# Patient Record
Sex: Male | Born: 1990 | Race: Black or African American | Hispanic: No | Marital: Single | Smoking: Current some day smoker
Health system: Southern US, Community
[De-identification: ages and names within clinical notes are randomized; demographics above are authoritative.]

## PROBLEM LIST (undated history)

## (undated) DIAGNOSIS — I10 Essential (primary) hypertension: Secondary | ICD-10-CM

## (undated) DIAGNOSIS — I219 Acute myocardial infarction, unspecified: Secondary | ICD-10-CM

## (undated) HISTORY — PX: CARDIAC CATHETERIZATION: SHX172

---
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
## 2012-06-28 ENCOUNTER — Emergency Department: Payer: Self-pay | Admitting: Emergency Medicine

## 2015-09-03 ENCOUNTER — Emergency Department
Admission: EM | Admit: 2015-09-03 | Discharge: 2015-09-03 | Disposition: A | Discharge status: Home / Self Care | Payer: Worker's Compensation | Attending: Emergency Medicine | Admitting: Emergency Medicine

## 2015-09-03 ENCOUNTER — Encounter: Payer: Self-pay | Admitting: Emergency Medicine

## 2015-09-03 ENCOUNTER — Emergency Department: Payer: Worker's Compensation

## 2015-09-03 DIAGNOSIS — W208XXA Other cause of strike by thrown, projected or falling object, initial encounter: Secondary | ICD-10-CM | POA: Insufficient documentation

## 2015-09-03 DIAGNOSIS — S90212A Contusion of left great toe with damage to nail, initial encounter: Secondary | ICD-10-CM | POA: Diagnosis not present

## 2015-09-03 DIAGNOSIS — Y9389 Activity, other specified: Secondary | ICD-10-CM | POA: Insufficient documentation

## 2015-09-03 DIAGNOSIS — F17200 Nicotine dependence, unspecified, uncomplicated: Secondary | ICD-10-CM | POA: Diagnosis not present

## 2015-09-03 DIAGNOSIS — S90222D Contusion of left lesser toe(s) with damage to nail, subsequent encounter: Secondary | ICD-10-CM

## 2015-09-03 DIAGNOSIS — Y990 Civilian activity done for income or pay: Secondary | ICD-10-CM | POA: Diagnosis not present

## 2015-09-03 DIAGNOSIS — S99922A Unspecified injury of left foot, initial encounter: Secondary | ICD-10-CM | POA: Diagnosis present

## 2015-09-03 DIAGNOSIS — Y929 Unspecified place or not applicable: Secondary | ICD-10-CM | POA: Insufficient documentation

## 2015-09-03 MED ORDER — SULFAMETHOXAZOLE-TRIMETHOPRIM 800-160 MG PO TABS: 1.0000 | ORAL_TABLET | Freq: Two times a day (BID) | ORAL | 0 refills | Status: DC

## 2015-09-03 NOTE — ED Triage Notes (Signed)
Pt dropped a wooden pallet on left great toe two days ago and now having more swelling and bruising. Sent over from [facility] for further eval.

## 2015-09-03 NOTE — ED Notes (Signed)
Xray at the bedside,.

## 2015-09-03 NOTE — ED Notes (Signed)
Spoke with Dennis Stewart [phone]) from [employer], she stated that pt had W/C drug screen [facility] with [facility] yesterday and that it does not need to be repeated today since this is a referral for further treatment.

## 2015-09-03 NOTE — ED Provider Notes (Signed)
[facility] Emergency Department Provider Note  ____________________________________________  Time seen: Approximately 1:49 PM  I have reviewed the triage vital signs and the nursing notes.   HISTORY  Chief Complaint Toe Injury   HPI Dennis Stewart is a 25 y.o. male who presents to the emergency department for evaluation of pain in his left great toe. While at work on Tuesday, he dropped a pallet directly on it. He was evaluated at Urgent care. He is unsure if the toe is broken, but he did have an xray. He returned there today and was advised to come to the ER for further evaluation and treatment. The toe has gotten red, more swollen, more tender, and now has a large blood blister that is lifting the nail off.  History reviewed. No pertinent past medical history.  There are no active problems to display for this patient.   History reviewed. No pertinent surgical history.  Prior to Admission medications   Medication Sig Start Date End Date Taking? Authorizing Provider  sulfamethoxazole-trimethoprim (BACTRIM DS,SEPTRA DS) 800-160 MG tablet Take 1 tablet by mouth 2 (two) times daily. 09/03/15   Chinita Pesterari B Sena Hoopingarner, FNP    Allergies Penicillins  No family history on file.  Social History Social History  Substance Use Topics  . Smoking status: Current Some Day Smoker  . Smokeless tobacco: Never Used  . Alcohol use Yes    Review of Systems  Constitutional: Negative for fever/chills Respiratory: Negative for shortness of breath. Musculoskeletal: Negative for pain. Skin: Positive for hematoma. Neurological: Negative for headaches, focal weakness or numbness. ____________________________________________   PHYSICAL EXAM:  VITAL SIGNS: ED Triage Vitals [09/03/15 1305]  Enc Vitals Group     BP (!) 167/87     Pulse Rate 83     Resp 20     Temp 97.8 F (36.6 C)     Temp Source Oral     SpO2 100 %     Weight 130 lb (59 kg)     Height 5\' 7"   (1.702 m)     Head Circumference      Peak Flow      Pain Score 6     Pain Loc      Pain Edu?      Excl. in GC?      Constitutional: Alert and oriented. Well appearing and in no acute distress. Eyes: Conjunctivae are normal. EOMI. Nose: No congestion/rhinnorhea. Mouth/Throat: Mucous membranes are moist.   Neck: No stridor. Cardiovascular: Good peripheral circulation. Respiratory: Normal respiratory effort.  No retractions. Musculoskeletal: FROM throughout. Neurologic:  Normal speech and language. No gross focal neurologic deficits are appreciated. Skin:  Bulging hematoma surrounding the nail of the great toe on the left foot. Toenail remains firmly attached to the nailbed.   ____________________________________________   LABS (all labs ordered are listed, but only abnormal results are displayed)  Labs Reviewed - No data to display ____________________________________________  EKG   ____________________________________________  RADIOLOGY  No acute bony abnormality of the left great toe per radiology. ____________________________________________   PROCEDURES  Procedure(s) performed: After cleansing the toe with alcohol, subungal hematoma of the left great toe drained via electrocautery. Surrounding hematoma drained with #18 gauge needle with 1 small puncture on the lateral aspect of the great toe.  ____________________________________________   INITIAL IMPRESSION / ASSESSMENT AND PLAN / ED COURSE  Pertinent labs & imaging results that were available during my care of the patient were reviewed by me and considered in my medical  decision making (see chart for details).  Case discussed with Dr. Orland Jarred who advised draining the hematoma. He will see him in follow up in the office next week.  He will be advised to take the bactrim as prescribed.  He was advised to follow up with Dr. Orland Jarred.  He was also advised to return to the emergency department for symptoms that  change or worsen if unable to schedule an appointment.  ____________________________________________   FINAL CLINICAL IMPRESSION(S) / ED DIAGNOSES  Final diagnoses:  Subungual hematoma of toe of left foot, subsequent encounter    New Prescriptions   SULFAMETHOXAZOLE-TRIMETHOPRIM (BACTRIM DS,SEPTRA DS) 800-160 MG TABLET    Take 1 tablet by mouth 2 (two) times daily.    Note:  This document was prepared using Dragon voice recognition software and may include unintentional dictation errors.    Chinita Pester, FNP 09/03/15 1621    Gayla Doss, MD 09/04/15 [phone]

## 2015-12-12 ENCOUNTER — Emergency Department
Admission: EM | Admit: 2015-12-12 | Discharge: 2015-12-12 | Disposition: A | Discharge status: Home / Self Care | Payer: No Typology Code available for payment source | Attending: Student in an Organized Health Care Education/Training Program | Admitting: Student in an Organized Health Care Education/Training Program

## 2015-12-12 ENCOUNTER — Emergency Department: Payer: No Typology Code available for payment source

## 2015-12-12 ENCOUNTER — Encounter: Payer: Self-pay | Admitting: Emergency Medicine

## 2015-12-12 DIAGNOSIS — F17210 Nicotine dependence, cigarettes, uncomplicated: Secondary | ICD-10-CM | POA: Insufficient documentation

## 2015-12-12 DIAGNOSIS — S79911A Unspecified injury of right hip, initial encounter: Secondary | ICD-10-CM | POA: Diagnosis present

## 2015-12-12 DIAGNOSIS — Y9389 Activity, other specified: Secondary | ICD-10-CM | POA: Diagnosis not present

## 2015-12-12 DIAGNOSIS — S7001XA Contusion of right hip, initial encounter: Secondary | ICD-10-CM | POA: Insufficient documentation

## 2015-12-12 DIAGNOSIS — Y999 Unspecified external cause status: Secondary | ICD-10-CM | POA: Diagnosis not present

## 2015-12-12 DIAGNOSIS — Z79899 Other long term (current) drug therapy: Secondary | ICD-10-CM | POA: Insufficient documentation

## 2015-12-12 DIAGNOSIS — Y92410 Unspecified street and highway as the place of occurrence of the external cause: Secondary | ICD-10-CM | POA: Insufficient documentation

## 2015-12-12 MED ORDER — NAPROXEN 500 MG PO TBEC: 500.0000 mg | DELAYED_RELEASE_TABLET | Freq: Two times a day (BID) | ORAL | 0 refills | Status: AC

## 2015-12-12 MED ORDER — CYCLOBENZAPRINE HCL 5 MG PO TABS: 5.0000 mg | ORAL_TABLET | Freq: Three times a day (TID) | ORAL | 0 refills | Status: DC | PRN

## 2015-12-12 NOTE — ED Notes (Signed)
Pt verbalized understanding of discharge instructions. NAD at this time. 
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
## 2018-03-07 ENCOUNTER — Observation Stay
Admission: EM | Admit: 2018-03-07 | Discharge: 2018-03-08 | Disposition: A | Discharge status: Home / Self Care | Payer: Managed Care, Other (non HMO) | Attending: Internal Medicine | Admitting: Internal Medicine

## 2018-03-07 ENCOUNTER — Other Ambulatory Visit: Payer: Self-pay

## 2018-03-07 ENCOUNTER — Encounter: Payer: Self-pay | Admitting: Emergency Medicine

## 2018-03-07 DIAGNOSIS — Z7982 Long term (current) use of aspirin: Secondary | ICD-10-CM | POA: Insufficient documentation

## 2018-03-07 DIAGNOSIS — I2510 Atherosclerotic heart disease of native coronary artery without angina pectoris: Secondary | ICD-10-CM | POA: Insufficient documentation

## 2018-03-07 DIAGNOSIS — I10 Essential (primary) hypertension: Secondary | ICD-10-CM | POA: Diagnosis present

## 2018-03-07 DIAGNOSIS — I252 Old myocardial infarction: Secondary | ICD-10-CM | POA: Diagnosis not present

## 2018-03-07 DIAGNOSIS — Z880 Allergy status to penicillin: Secondary | ICD-10-CM | POA: Insufficient documentation

## 2018-03-07 DIAGNOSIS — F17210 Nicotine dependence, cigarettes, uncomplicated: Secondary | ICD-10-CM | POA: Insufficient documentation

## 2018-03-07 DIAGNOSIS — Z7902 Long term (current) use of antithrombotics/antiplatelets: Secondary | ICD-10-CM | POA: Diagnosis not present

## 2018-03-07 DIAGNOSIS — F410 Panic disorder [episodic paroxysmal anxiety] without agoraphobia: Secondary | ICD-10-CM | POA: Insufficient documentation

## 2018-03-07 DIAGNOSIS — Z79899 Other long term (current) drug therapy: Secondary | ICD-10-CM | POA: Diagnosis not present

## 2018-03-07 DIAGNOSIS — F419 Anxiety disorder, unspecified: Secondary | ICD-10-CM | POA: Insufficient documentation

## 2018-03-07 DIAGNOSIS — J101 Influenza due to other identified influenza virus with other respiratory manifestations: Secondary | ICD-10-CM | POA: Diagnosis not present

## 2018-03-07 DIAGNOSIS — E876 Hypokalemia: Secondary | ICD-10-CM | POA: Diagnosis not present

## 2018-03-07 DIAGNOSIS — I160 Hypertensive urgency: Principal | ICD-10-CM | POA: Insufficient documentation

## 2018-03-07 DIAGNOSIS — R040 Epistaxis: Secondary | ICD-10-CM | POA: Diagnosis present

## 2018-03-07 DIAGNOSIS — R041 Hemorrhage from throat: Secondary | ICD-10-CM | POA: Diagnosis not present

## 2018-03-07 DIAGNOSIS — E785 Hyperlipidemia, unspecified: Secondary | ICD-10-CM | POA: Diagnosis not present

## 2018-03-07 HISTORY — DX: Essential (primary) hypertension: I10

## 2018-03-07 HISTORY — DX: Acute myocardial infarction, unspecified: I21.9

## 2018-03-07 LAB — BASIC METABOLIC PANEL
Anion gap: 5 (ref 5–15)
BUN: 17 mg/dL (ref 6–20)
CO2: 28 mmol/L (ref 22–32)
Calcium: 8.9 mg/dL (ref 8.9–10.3)
Chloride: 106 mmol/L (ref 98–111)
Creatinine, Ser: 0.94 mg/dL (ref 0.61–1.24)
GFR calc Af Amer: >60 mL/min (ref >60)
GFR calc non Af Amer: >60 mL/min (ref >60)
Glucose, Bld: 109 mg/dL — ABNORMAL HIGH (ref 70–99)
Potassium: 3.4 mmol/L — ABNORMAL LOW (ref 3.5–5.1)
SODIUM: 139 mmol/L (ref 135–145)

## 2018-03-07 LAB — CBC WITH DIFFERENTIAL/PLATELET
Abs Immature Granulocytes: 0.02 10*3/uL (ref 0.00–0.07)
Basophils Absolute: 0 10*3/uL (ref 0.0–0.1)
Basophils Relative: 0 %
EOS ABS: 0 10*3/uL (ref 0.0–0.5)
Eosinophils Relative: 1 %
HCT: 38.7 % — ABNORMAL LOW (ref 39.0–52.0)
Hemoglobin: 13.1 g/dL (ref 13.0–17.0)
Immature Granulocytes: 0 %
Lymphocytes Relative: 12 %
Lymphs Abs: 1 10*3/uL (ref 0.7–4.0)
MCH: 29.4 pg (ref 26.0–34.0)
MCHC: 33.9 g/dL (ref 30.0–36.0)
MCV: 86.8 fL (ref 80.0–100.0)
Monocytes Absolute: 0.6 10*3/uL (ref 0.1–1.0)
Monocytes Relative: 7 %
Neutro Abs: 6.5 10*3/uL (ref 1.7–7.7)
Neutrophils Relative %: 80 %
PLATELETS: 273 10*3/uL (ref 150–400)
RBC: 4.46 MIL/uL (ref 4.22–5.81)
RDW: 12.2 % (ref 11.5–15.5)
WBC: 8.1 10*3/uL (ref 4.0–10.5)
nRBC: 0 % (ref 0.0–0.2)

## 2018-03-07 LAB — RESPIRATORY PANEL BY PCR
Adenovirus: NOT DETECTED
Bordetella pertussis: NOT DETECTED
Chlamydophila pneumoniae: NOT DETECTED
Coronavirus 229E: NOT DETECTED
Coronavirus HKU1: NOT DETECTED
Coronavirus NL63: NOT DETECTED
Coronavirus OC43: NOT DETECTED
Influenza A H1 2009: DETECTED — AB
Influenza B: NOT DETECTED
Metapneumovirus: NOT DETECTED
Mycoplasma pneumoniae: NOT DETECTED
Parainfluenza Virus 1: NOT DETECTED
Parainfluenza Virus 2: NOT DETECTED
Parainfluenza Virus 3: NOT DETECTED
Parainfluenza Virus 4: NOT DETECTED
RHINOVIRUS / ENTEROVIRUS - RVPPCR: NOT DETECTED
Respiratory Syncytial Virus: NOT DETECTED

## 2018-03-07 LAB — PROTIME-INR
INR: 0.92
PROTHROMBIN TIME: 12.3 s (ref 11.4–15.2)

## 2018-03-07 LAB — TSH: TSH: 1.149 u[IU]/mL (ref 0.350–4.500)

## 2018-03-07 LAB — TROPONIN I: Troponin I: <0.03 ng/mL (ref <0.03)

## 2018-03-07 LAB — INFLUENZA PANEL BY PCR (TYPE A & B)
Influenza A By PCR: POSITIVE — AB
Influenza B By PCR: NEGATIVE

## 2018-03-07 MED ORDER — CLONIDINE HCL 0.1 MG PO TABS
0.1000 mg | ORAL_TABLET | Freq: Once | ORAL | Status: DC
  Filled 2018-03-07: qty 1

## 2018-03-07 MED ORDER — AZITHROMYCIN 250 MG PO TABS
250.0000 mg | ORAL_TABLET | Freq: Every day | ORAL | Status: DC
  Administered 2018-03-08: 250 mg via ORAL
  Filled 2018-03-07: qty 1

## 2018-03-07 MED ORDER — ACETAMINOPHEN 650 MG RE SUPP: 650.0000 mg | Freq: Four times a day (QID) | RECTAL | Status: DC | PRN

## 2018-03-07 MED ORDER — AZITHROMYCIN 250 MG PO TABS
500.0000 mg | ORAL_TABLET | Freq: Every day | ORAL | Status: AC
  Administered 2018-03-07: 500 mg via ORAL
  Filled 2018-03-07: qty 2

## 2018-03-07 MED ORDER — HYDRALAZINE HCL 20 MG/ML IJ SOLN
10.0000 mg | Freq: Once | INTRAMUSCULAR | Status: AC
  Administered 2018-03-07: 10 mg via INTRAVENOUS

## 2018-03-07 MED ORDER — MENTHOL 3 MG MT LOZG
1.0000 | LOZENGE | OROMUCOSAL | Status: DC | PRN
  Administered 2018-03-07 – 2018-03-08 (×3): 3 mg via ORAL
  Filled 2018-03-07: qty 9

## 2018-03-07 MED ORDER — CLINDAMYCIN PHOSPHATE 600 MG/50ML IV SOLN
600.0000 mg | Freq: Once | INTRAVENOUS | Status: AC
  Administered 2018-03-07: 600 mg via INTRAVENOUS
  Filled 2018-03-07: qty 50

## 2018-03-07 MED ORDER — HYDRALAZINE HCL 20 MG/ML IJ SOLN
10.0000 mg | Freq: Four times a day (QID) | INTRAMUSCULAR | Status: DC | PRN
  Administered 2018-03-07: 10 mg via INTRAVENOUS
  Filled 2018-03-07: qty 1

## 2018-03-07 MED ORDER — NITROGLYCERIN 2 % TD OINT
0.5000 [in_us] | TOPICAL_OINTMENT | TRANSDERMAL | Status: AC
  Administered 2018-03-07: 0.5 [in_us] via TOPICAL
  Filled 2018-03-07: qty 1

## 2018-03-07 MED ORDER — ONDANSETRON HCL 4 MG/2ML IJ SOLN: 4.0000 mg | Freq: Four times a day (QID) | INTRAMUSCULAR | Status: DC | PRN

## 2018-03-07 MED ORDER — OSELTAMIVIR PHOSPHATE 75 MG PO CAPS
75.0000 mg | ORAL_CAPSULE | Freq: Two times a day (BID) | ORAL | Status: DC
  Administered 2018-03-07 – 2018-03-08 (×2): 75 mg via ORAL
  Filled 2018-03-07 (×2): qty 1

## 2018-03-07 MED ORDER — CLINDAMYCIN PHOSPHATE 600 MG/4ML IJ SOLN
600.0000 mg | Freq: Once | INTRAMUSCULAR | Status: DC
  Filled 2018-03-07: qty 4

## 2018-03-07 MED ORDER — CLINDAMYCIN PHOSPHATE 600 MG/50ML IV SOLN: 600.0000 mg | Freq: Once | INTRAVENOUS | Status: DC

## 2018-03-07 MED ORDER — LORAZEPAM 2 MG/ML IJ SOLN
0.5000 mg | Freq: Once | INTRAMUSCULAR | Status: AC
  Administered 2018-03-07: 0.5 mg via INTRAVENOUS
  Filled 2018-03-07: qty 1

## 2018-03-07 MED ORDER — HYDRALAZINE HCL 20 MG/ML IJ SOLN
10.0000 mg | Freq: Once | INTRAMUSCULAR | Status: AC
  Administered 2018-03-07: 10 mg via INTRAVENOUS
  Filled 2018-03-07: qty 1

## 2018-03-07 MED ORDER — METOPROLOL SUCCINATE ER 25 MG PO TB24
25.0000 mg | ORAL_TABLET | Freq: Every day | ORAL | Status: DC
  Administered 2018-03-07: 25 mg via ORAL
  Filled 2018-03-07: qty 1

## 2018-03-07 MED ORDER — ACETAMINOPHEN 325 MG PO TABS
650.0000 mg | ORAL_TABLET | Freq: Four times a day (QID) | ORAL | Status: DC | PRN
  Administered 2018-03-07 – 2018-03-08 (×4): 650 mg via ORAL
  Filled 2018-03-07 (×4): qty 2

## 2018-03-07 MED ORDER — ONDANSETRON HCL 4 MG PO TABS: 4.0000 mg | ORAL_TABLET | Freq: Four times a day (QID) | ORAL | Status: DC | PRN

## 2018-03-07 MED ORDER — POTASSIUM CHLORIDE CRYS ER 20 MEQ PO TBCR
40.0000 meq | EXTENDED_RELEASE_TABLET | Freq: Once | ORAL | Status: AC
  Administered 2018-03-07: 40 meq via ORAL
  Filled 2018-03-07: qty 2

## 2018-03-07 MED ORDER — LISINOPRIL 20 MG PO TABS
20.0000 mg | ORAL_TABLET | Freq: Every day | ORAL | Status: DC
  Administered 2018-03-07 – 2018-03-08 (×2): 20 mg via ORAL
  Filled 2018-03-07 (×2): qty 1

## 2018-03-07 MED ORDER — CLINDAMYCIN HCL 150 MG PO CAPS: 300.0000 mg | ORAL_CAPSULE | Freq: Once | ORAL | Status: DC

## 2018-03-07 MED ORDER — LABETALOL HCL 5 MG/ML IV SOLN
10.0000 mg | INTRAVENOUS | Status: AC
  Administered 2018-03-07: 10 mg via INTRAVENOUS
  Filled 2018-03-07: qty 4

## 2018-03-07 MED ORDER — DOCUSATE SODIUM 100 MG PO CAPS
100.0000 mg | ORAL_CAPSULE | Freq: Two times a day (BID) | ORAL | Status: DC
  Administered 2018-03-07 – 2018-03-08 (×3): 100 mg via ORAL
  Filled 2018-03-07 (×3): qty 1

## 2018-03-07 MED ORDER — HYDRALAZINE HCL 25 MG PO TABS
25.0000 mg | ORAL_TABLET | Freq: Three times a day (TID) | ORAL | Status: DC
  Administered 2018-03-07 – 2018-03-08 (×3): 25 mg via ORAL
  Filled 2018-03-07 (×3): qty 1

## 2018-03-07 NOTE — H&P (Signed)
Dennis Stewart is an 28 y.o. male.   Chief Complaint: Nosebleed HPI: The patient with past medical history of hypertension and CAD status post MI presents to the emergency department due to nosebleed.  The patient reports that she has had a nosebleed for the last 3 nights presumably due to dry nose from blowing her nose frequently due to viral URI.  Denied the patient had more bleeding than he could control.  His nose was packed in the emergency department.  During the procedure the patient had a panic attack which prompted the emergency department staff to give Ativan.  The patient's blood pressure was found to be greater than 180/100.  Nitropaste was applied to his chest and the patient was given 2 doses of hydralazine prior to the emergency department staff call hospitalist service for admission  Past Medical History:  Diagnosis Date  . Hypertension   . MI (myocardial infarction) [facility](HCC)     Past Surgical History:  Procedure Laterality Date  . CARDIAC CATHETERIZATION      No family history on file. Unknown by patient  Social History:  reports that he has been smoking cigarettes. He has been smoking about 0.25 packs per day. He has never used smokeless tobacco. He reports current alcohol use. He reports previous drug use.  Allergies:  Allergies  Allergen Reactions  . Penicillins Anaphylaxis    Did it involve swelling of the face/tongue/throat, SOB, or low BP? Yes Did it involve sudden or severe rash/hives, skin peeling, or any reaction on the inside of your mouth or nose? Yes Did you need to seek medical attention at a hospital or doctor's office? No When did it last happen?childhood If all above answers are "NO", may proceed with cephalosporin use.     Prior to Admission medications   Medication Sig Start Date End Date Taking? Authorizing Provider  aspirin 81 MG chewable tablet Chew 81 mg by mouth daily. 11/17/17  Yes [provider]  atorvastatin (LIPITOR) 80 MG  tablet Take 80 mg by mouth at bedtime.  12/23/17   [provider]  clopidogrel (PLAVIX) 75 MG tablet Take 75 mg by mouth daily. 02/28/18   [provider]  isosorbide mononitrate (IMDUR) 30 MG 24 hr tablet Take 30 mg by mouth daily. 11/17/17   [provider]  lisinopril (PRINIVIL,ZESTRIL) 20 MG tablet Take 20 mg by mouth daily. 11/19/17   [provider]  metoprolol succinate (TOPROL-XL) 25 MG 24 hr tablet Take 25 mg by mouth daily. 12/23/17   [provider]     Results for orders placed or performed during the hospital encounter of 03/07/18 (from the past 48 hour(s))  CBC with Differential     Status: Abnormal   Collection Time: 03/07/18  3:47 AM  Result Value Ref Range   WBC 8.1 4.0 - 10.5 K/uL   RBC 4.46 4.22 - 5.81 MIL/uL   Hemoglobin 13.1 13.0 - 17.0 g/dL   HCT 16.138.7 (L) 09.639.0 - 04.552.0 %   MCV 86.8 80.0 - 100.0 fL   MCH 29.4 26.0 - 34.0 pg   MCHC 33.9 30.0 - 36.0 g/dL   RDW 40.912.2 81.111.5 - 91.415.5 %   Platelets 273 150 - 400 K/uL   nRBC 0.0 0.0 - 0.2 %   Neutrophils Relative % 80 %   Neutro Abs 6.5 1.7 - 7.7 K/uL   Lymphocytes Relative 12 %   Lymphs Abs 1.0 0.7 - 4.0 K/uL   Monocytes Relative 7 %  Monocytes Absolute 0.6 0.1 - 1.0 K/uL   Eosinophils Relative 1 %   Eosinophils Absolute 0.0 0.0 - 0.5 K/uL   Basophils Relative 0 %   Basophils Absolute 0.0 0.0 - 0.1 K/uL   Immature Granulocytes 0 %   Abs Immature Granulocytes 0.02 0.00 - 0.07 K/uL    Comment: Performed at [facility], [address] [place], [place] [address]  Basic metabolic panel     Status: Abnormal   Collection Time: 03/07/18  3:47 AM  Result Value Ref Range   Sodium 139 135 - 145 mmol/L   Potassium 3.4 (L) 3.5 - 5.1 mmol/L   Chloride 106 98 - 111 mmol/L   CO2 28 22 - 32 mmol/L   Glucose, Bld 109 (H) 70 - 99 mg/dL   BUN 17 6 - 20 mg/dL   Creatinine, Ser 6.040.94 0.61 - 1.24 mg/dL   Calcium 8.9 8.9 - 54.010.3 mg/dL   GFR calc non Af Amer >60 >60 mL/min   GFR  calc Af Amer >60 >60 mL/min   Anion gap 5 5 - 15    Comment: Performed at [facility], [address] [place], [place] [address]  Protime-INR     Status: None   Collection Time: 03/07/18  3:47 AM  Result Value Ref Range   Prothrombin Time 12.3 11.4 - 15.2 seconds   INR 0.92     Comment: Performed at [facility], [address] [place], [place] [address]  Troponin I - Add-On to previous collection     Status: None   Collection Time: 03/07/18  3:47 AM  Result Value Ref Range   Troponin I <0.03 <0.03 ng/mL    Comment: Performed at [facility], [address] [place], [place] [address]  TSH     Status: None   Collection Time: 03/07/18  3:47 AM  Result Value Ref Range   TSH 1.149 0.350 - 4.500 uIU/mL    Comment: Performed by a 3rd Generation assay with a functional sensitivity of <=0.01 uIU/mL. Performed at [facility], [address] [place], [place] [address]    No results found.  Review of Systems  Constitutional: Negative for chills and fever.  HENT: Negative for sore throat and tinnitus.   Eyes: Negative for blurred vision and redness.  Respiratory: Negative for cough and shortness of breath.   Cardiovascular: Negative for chest pain, palpitations, orthopnea and PND.  Gastrointestinal: Negative for abdominal pain, diarrhea, nausea and vomiting.  Genitourinary: Negative for dysuria, frequency and urgency.  Musculoskeletal: Negative for joint pain and myalgias.  Skin: Negative for rash.       No lesions  Neurological: Negative for speech change, focal weakness and weakness.  Endo/Heme/Allergies: Does not bruise/bleed easily.       No temperature intolerance  Psychiatric/Behavioral: Negative for depression and suicidal ideas.    Blood pressure (!) 181/102, pulse (!) 110, temperature 100 F (37.8 C), temperature source Oral, resp. rate 20, height 5\' 7"  (1.702 m), weight 72.6 kg, SpO2 99 %. Physical Exam  Vitals  reviewed. Constitutional: He is oriented to person, place, and time. He appears well-developed and well-nourished. No distress.  HENT:  Head: Normocephalic and atraumatic.  Mouth/Throat: Oropharynx is clear and moist.  Eyes: Pupils are equal, round, and reactive to light. Conjunctivae and EOM are normal. No scleral icterus.  Neck: Normal range of motion. Neck supple. No JVD present. No tracheal deviation present. No thyromegaly present.  Cardiovascular: Normal rate, regular rhythm and  normal heart sounds. Exam reveals no gallop and no friction rub.  No murmur heard. Respiratory: Effort normal and breath sounds normal. No respiratory distress.  GI: Soft. Bowel sounds are normal. He exhibits no distension.  Genitourinary:    Genitourinary Comments: Deferred   Musculoskeletal: Normal range of motion.        General: No edema.  Lymphadenopathy:    He has no cervical adenopathy.  Neurological: He is alert and oriented to person, place, and time. No cranial nerve deficit.  Skin: Skin is warm and dry. No rash noted. No erythema.  Psychiatric: He has a normal mood and affect. His behavior is normal. Judgment and thought content normal.     Assessment/Plan This is a 28 year old male admitted for transfer urgency. 1.  Hypertensive urgency: Without chest pain or shortness of breath with the patient does have a nosebleed.  Pressure is decreased from 182/110 to 164/100 at this time.  I will give a dose of labetalol 10 mg IV to achieve full blood pressure of no less than 140/80 so is not to exceed 30% drop.  Cardiac enzymes negative at this time.  No EKG changes.  Continue lisinopril and metoprolol 2.  Epistaxis: The patient is on aspirin and Plavix.  Nose is packed at this time.  Evidence of posterior pharyngeal bleed.  The ED staff was given clindamycin 3.  CAD: Stable; benefit seems to outweigh the risk of continuing aspirin and Plavix at this time.  Monitor telemetry.  Imdur per home regimen 4.   Hypokalemia: Replete potassium 5.  Hyperlipidemia: Continue statin therapy 6.  DVT prophylaxis: SCDs 7.  GI prophylaxis: None The patient is a full code.  Time spent on admission orders and patient care approximately 45 minutes  Arnaldo Natal, MD 03/07/2018, 6:58 AM

## 2018-03-07 NOTE — Plan of Care (Signed)
  Problem: Education: Goal: Knowledge of General Education information will improve Description Including pain rating scale, medication(s)/side effects and non-pharmacologic comfort measures Outcome: Progressing   

## 2018-03-07 NOTE — ED Triage Notes (Signed)
Pt presents to ED with nosebleed for the past 3 days. Is currently taking blood thinners due to MI last year. Does have hx of HTN.

## 2018-03-07 NOTE — ED Notes (Signed)
Pt has some oozing from right eye lid. Dr Dolores Frame at bedside. Orders recieved

## 2018-03-07 NOTE — ED Notes (Signed)
ED TO INPATIENT HANDOFF REPORT  ED Nurse Name and Phone #: Legrand ComoLinda RN  [phone]  Name/Age/Gender Dennis Stewart 28 y.o. male Room/Bed: ED13A/ED13A  Code Status   Code Status: Not on file  Home/SNF/Other Patient oriented to: self, place, time and situation Is this baseline yes  Triage Complete: Triage complete   Chief Complaint nose bleed  Triage Note Pt presents to ED with nosebleed for the past 3 days. Is currently taking blood thinners due to MI last year. Does have hx of HTN.    Allergies Allergies  Allergen Reactions  . Penicillins Anaphylaxis    Did it involve swelling of the face/tongue/throat, SOB, or low BP? Yes Did it involve sudden or severe rash/hives, skin peeling, or any reaction on the inside of your mouth or nose? Yes Did you need to seek medical attention at a hospital or doctor's office? No When did it last happen?childhood If all above answers are "NO", may proceed with cephalosporin use.     Level of Care/Admitting Diagnosis ED Disposition    ED Disposition Condition Comment   Admit  Hospital Area: [facility] [[id number]]  Level of Care: Med-Surg [16]  Diagnosis: Hypertensive urgency [454098][[id number]]  Admitting Physician: Arnaldo NatalDIAMOND, MICHAEL S [[id number]]  Attending Physician: Arnaldo NatalDIAMOND, MICHAEL S [[id number]]  PT Class (Do Not Modify): Observation [104]  PT Acc Code (Do Not Modify): Observation [10022]       Medical/Surgery History Past Medical History:  Diagnosis Date  . Hypertension   . MI (myocardial infarction) [facility](HCC)    Past Surgical History:  Procedure Laterality Date  . CARDIAC CATHETERIZATION       IV Location/Drains/Wounds Patient Lines/Drains/Airways Status   Active Line/Drains/Airways    Name:   Placement date:   Placement time:   Site:   Days:   Peripheral IV 03/07/18 Left Hand   03/07/18    0345    Hand   less than 1          Intake/Output Last 24 hours No intake or output data in the 24 hours ending 03/07/18  0747  Labs/Imaging Results for orders placed or performed during the hospital encounter of 03/07/18 (from the past 48 hour(s))  CBC with Differential     Status: Abnormal   Collection Time: 03/07/18  3:47 AM  Result Value Ref Range   WBC 8.1 4.0 - 10.5 K/uL   RBC 4.46 4.22 - 5.81 MIL/uL   Hemoglobin 13.1 13.0 - 17.0 g/dL   HCT 08.638.7 (L) 57.839.0 - 46.952.0 %   MCV 86.8 80.0 - 100.0 fL   MCH 29.4 26.0 - 34.0 pg   MCHC 33.9 30.0 - 36.0 g/dL   RDW 62.912.2 52.811.5 - 41.315.5 %   Platelets 273 150 - 400 K/uL   nRBC 0.0 0.0 - 0.2 %   Neutrophils Relative % 80 %   Neutro Abs 6.5 1.7 - 7.7 K/uL   Lymphocytes Relative 12 %   Lymphs Abs 1.0 0.7 - 4.0 K/uL   Monocytes Relative 7 %   Monocytes Absolute 0.6 0.1 - 1.0 K/uL   Eosinophils Relative 1 %   Eosinophils Absolute 0.0 0.0 - 0.5 K/uL   Basophils Relative 0 %   Basophils Absolute 0.0 0.0 - 0.1 K/uL   Immature Granulocytes 0 %   Abs Immature Granulocytes 0.02 0.00 - 0.07 K/uL    Comment: Performed at [facility], [address] [place], [place] [address]  Basic metabolic panel     Status: Abnormal  Collection Time: 03/07/18  3:47 AM  Result Value Ref Range   Sodium 139 135 - 145 mmol/L   Potassium 3.4 (L) 3.5 - 5.1 mmol/L   Chloride 106 98 - 111 mmol/L   CO2 28 22 - 32 mmol/L   Glucose, Bld 109 (H) 70 - 99 mg/dL   BUN 17 6 - 20 mg/dL   Creatinine, Ser 9.93 0.61 - 1.24 mg/dL   Calcium 8.9 8.9 - 71.6 mg/dL   GFR calc non Af Amer >60 >60 mL/min   GFR calc Af Amer >60 >60 mL/min   Anion gap 5 5 - 15    Comment: Performed at [facility], [address] [place], [place] [address]  Protime-INR     Status: None   Collection Time: 03/07/18  3:47 AM  Result Value Ref Range   Prothrombin Time 12.3 11.4 - 15.2 seconds   INR 0.92     Comment: Performed at [facility], [address] [place], [place] [address]  Troponin I - Add-On to previous collection     Status: None   Collection Time: 03/07/18  3:47 AM  Result  Value Ref Range   Troponin I <0.03 <0.03 ng/mL    Comment: Performed at [facility], [address] [place], [place] [address]  TSH     Status: None   Collection Time: 03/07/18  3:47 AM  Result Value Ref Range   TSH 1.149 0.350 - 4.500 uIU/mL    Comment: Performed by a 3rd Generation assay with a functional sensitivity of <=0.01 uIU/mL. Performed at [facility], [address] [place], [place] [address]    No results found.  Pending Labs Unresulted Labs (From admission, onward)   None      Vitals/Pain Today's Vitals   03/07/18 0630 03/07/18 0700 03/07/18 0710 03/07/18 0742  BP: (!) 173/107 (!) 164/100  (!) 164/98  Pulse: (!) 112 (!) 112 (!) 110 99  Resp:    20  Temp:      TempSrc:      SpO2: 99% 97% 97% 99%  Weight:      Height:      PainSc:    0-No pain    Isolation Precautions No active isolations  Medications Medications  acetaminophen (TYLENOL) tablet 650 mg (650 mg Oral Given 03/07/18 7824)    Or  acetaminophen (TYLENOL) suppository 650 mg ( Rectal See Alternative 03/07/18 2353)  labetalol (NORMODYNE,TRANDATE) injection 10 mg (has no administration in time range)  hydrALAZINE (APRESOLINE) injection 10 mg (10 mg Intravenous Given 03/07/18 0355)  LORazepam (ATIVAN) injection 0.5 mg (0.5 mg Intravenous Given 03/07/18 0427)  hydrALAZINE (APRESOLINE) injection 10 mg (10 mg Intravenous Given 03/07/18 0430)  nitroGLYCERIN (NITROGLYN) 2 % ointment 0.5 inch (0.5 inches Topical Given 03/07/18 0538)  clindamycin (CLEOCIN) IVPB 600 mg (0 mg Intravenous Stopped 03/07/18 0711)    Mobility walks Low fall risk   Focused Assessments Pulmonary Assessment Handoff:  Lung sounds:   O2 Device: Room Air        Recommendations: See Admitting Provider Note  Report given to: Eula Listen  Additional Notes: family at bedside   No bleeding noted at present

## 2018-03-07 NOTE — Progress Notes (Signed)
Advanced care plan. Purpose of the Encounter: CODE STATUS Parties in Attendance: Patient Patient's Decision Capacity: Good Subjective/Patient's story: Presented to the emergency room for elevated blood pressure and nasal bleed Objective/Medical story Needs anterior nasal packing and control of blood pressure with multiple medications Goals of care determination:  Advance care directives and goals of care discussed, patient wants everything done which includes cpr, intubation and ventilator if need arises. CODE STATUS: Full code Time spent discussing advanced care planning: 16 minutes

## 2018-03-07 NOTE — Progress Notes (Addendum)
[employer] - [place] [facility]   PATIENT NAME: Dennis Stewart    MR#:  [id number]  DATE OF BIRTH:  Jul 08, 1990  SUBJECTIVE:  CHIEF COMPLAINT:   Chief Complaint  Patient presents with  . Epistaxis  Patient seen and evaluated today Has nasal packing on the right side No complaints of any shortness of breath, chest pain Has elevated blood pressure when he presented to the emergency room Complains of any dizziness  REVIEW OF SYSTEMS:    ROS  CONSTITUTIONAL: No documented fever. No fatigue, weakness. No weight gain, no weight loss.  EYES: No blurry or double vision.  ENT: No tinnitus. No postnasal drip. No redness of the oropharynx.  Has nasal packing RESPIRATORY: No cough, no wheeze, no hemoptysis. No dyspnea.  CARDIOVASCULAR: No chest pain. No orthopnea. No palpitations. No syncope.  GASTROINTESTINAL: No nausea, no vomiting or diarrhea. No abdominal pain. No melena or hematochezia.  GENITOURINARY: No dysuria or hematuria.  ENDOCRINE: No polyuria or nocturia. No heat or cold intolerance.  HEMATOLOGY: No anemia. No bruising. No bleeding.  INTEGUMENTARY: No rashes. No lesions.  MUSCULOSKELETAL: No arthritis. No swelling. No gout.  NEUROLOGIC: No numbness, tingling, or ataxia. No seizure-type activity.  PSYCHIATRIC: No anxiety. No insomnia. No ADD.   DRUG ALLERGIES:   Allergies  Allergen Reactions  . Penicillins Anaphylaxis    Did it involve swelling of the face/tongue/throat, SOB, or low BP? Yes Did it involve sudden or severe rash/hives, skin peeling, or any reaction on the inside of your mouth or nose? Yes Did you need to seek medical attention at a hospital or doctor's office? No When did it last happen?childhood If all above answers are "NO", may proceed with cephalosporin use.     VITALS:  Blood pressure (!) 158/91, pulse (!) 103, temperature 100.1 F (37.8 C), temperature source Oral, resp. rate 20, height 5\' 7"  (1.702 m), weight 72.6 kg,  SpO2 100 %.  PHYSICAL EXAMINATION:   Physical Exam  GENERAL:  28 y.o.-year-old patient lying in the bed with no acute distress.  EYES: Pupils equal, round, reactive to light and accommodation. No scleral icterus. Extraocular muscles intact.  HEENT: Head atraumatic, normocephalic. Oropharynx and nasopharynx clear.  Has nasal packing NECK:  Supple, no jugular venous distention. No thyroid enlargement, no tenderness.  LUNGS: Normal breath sounds bilaterally, no wheezing, rales, rhonchi. No use of accessory muscles of respiration.  CARDIOVASCULAR: S1, S2 normal. No murmurs, rubs, or gallops.  ABDOMEN: Soft, nontender, nondistended. Bowel sounds present. No organomegaly or mass.  EXTREMITIES: No cyanosis, clubbing or edema b/l.    NEUROLOGIC: Cranial nerves II through XII are intact. No focal Motor or sensory deficits b/l.   PSYCHIATRIC: The patient is alert and oriented x 3.  SKIN: No obvious rash, lesion, or ulcer.   LABORATORY PANEL:   CBC Recent Labs  Lab 03/07/18 0347  WBC 8.1  HGB 13.1  HCT 38.7*  PLT 273   ------------------------------------------------------------------------------------------------------------------ Chemistries  Recent Labs  Lab 03/07/18 0347  NA 139  K 3.4*  CL 106  CO2 28  GLUCOSE 109*  BUN 17  CREATININE 0.94  CALCIUM 8.9   ------------------------------------------------------------------------------------------------------------------  Cardiac Enzymes Recent Labs  Lab 03/07/18 0347  TROPONINI <0.03   ------------------------------------------------------------------------------------------------------------------  RADIOLOGY:  No results found.   ASSESSMENT AND PLAN:  28 year old male patient with history of coronary disease, hypertension, MI currently under hospitalist service nasal bleed and elevated blood pressure  -Hypertensive urgency Start patient on oral metoprolol, lisinopril and oral hydralazine  for better control of  blood pressure  -Epistaxis Aspirin and Plavix were held Nasal packing done which should stay for 5 days Outpatient ENT follow-up  -Hypokalemia Replace potassium aggressively  -Hyperlipidemia Continue statin therapy  -DVT prophylaxis sequential compression device to lower extremities  -Tobacco abuse Tobacco cessation counseled to the patient for 6 minutes Nicotine patch offered  All the records are reviewed and case discussed with Care Management/Social Worker. Management plans discussed with the patient, family and they are in agreement.  CODE STATUS: Full code  DVT Prophylaxis: SCDs  TOTAL TIME TAKING CARE OF THIS PATIENT: 45 minutes.   POSSIBLE D/C IN 2 to 3 DAYS, DEPENDING ON CLINICAL CONDITION.  Ihor Austin M.D on 03/07/2018 at 1:01 PM  Between 7am to 6pm - Pager - [phone]  After 6pm go to [email or web address] - password [email or web address] [facility]  [facility]  Office  [phone]  CC: Primary care physician; Center, [facility]  Note: This dictation was prepared with Nurse, children's dictation along with smaller phrase technology. Any transcriptional errors that result from this process are unintentional.

## 2018-03-07 NOTE — Progress Notes (Addendum)
MEWS of 4; VS increased.  See flowsheet.  Giving PRN tylenol and giving 2200 meds early.  Will recheck in an hour. On-call MD notified.

## 2018-03-07 NOTE — ED Notes (Signed)
Received report from [facility] RN waiting room assignments    Family at bedside

## 2018-03-07 NOTE — ED Provider Notes (Signed)
[facility] Emergency Department Provider Note   ____________________________________________   First MD Initiated Contact with Patient 03/07/18 [phone]     (approximate)  I have reviewed the triage vital signs and the nursing notes.   HISTORY  Chief Complaint Epistaxis    HPI Dennis Stewart is a 28 y.o. male who presents to the ED from home with a chief complaint of nosebleed.  Patient has a history of hypertension, MI on Plavix, compliant with his medications.  States he has had a cold recently and has been blowing his nose excessively.  Reports bleeding from the right naris nightly for the past 3 nights.  Tonight he was unable to control the bleeding at home.  Denies history of nosebleeds.  Denies headache, lightheadedness, chest pain, shortness of breath, abdominal pain, nausea, vomiting.  Denies recent travel or trauma.    Past Medical History:  Diagnosis Date  . Hypertension   . MI (myocardial infarction) [facility])     Patient Active Problem List   Diagnosis Date Noted  . Hypertensive urgency 03/07/2018    Past Surgical History:  Procedure Laterality Date  . CARDIAC CATHETERIZATION      Prior to Admission medications   Medication Sig Start Date End Date Taking? Authorizing Provider  aspirin 81 MG chewable tablet Chew 81 mg by mouth daily. 11/17/17  Yes [provider]  atorvastatin (LIPITOR) 80 MG tablet Take 80 mg by mouth at bedtime.  12/23/17   [provider]  clopidogrel (PLAVIX) 75 MG tablet Take 75 mg by mouth daily. 02/28/18   [provider]  isosorbide mononitrate (IMDUR) 30 MG 24 hr tablet Take 30 mg by mouth daily. 11/17/17   [provider]  lisinopril (PRINIVIL,ZESTRIL) 20 MG tablet Take 20 mg by mouth daily. 11/19/17   [provider]  metoprolol succinate (TOPROL-XL) 25 MG 24 hr tablet Take 25 mg by mouth daily. 12/23/17   [provider]    Allergies Penicillins  No  family history on file.  Social History Social History   Tobacco Use  . Smoking status: Current Some Day Smoker    Packs/day: 0.25    Types: Cigarettes  . Smokeless tobacco: Never Used  Substance Use Topics  . Alcohol use: Yes  . Drug use: Not Currently    Review of Systems  Constitutional: No fever/chills Eyes: No visual changes. ENT: Positive for nosebleed.  No sore throat. Cardiovascular: Denies chest pain. Respiratory: Denies shortness of breath. Gastrointestinal: No abdominal pain.  No nausea, no vomiting.  No diarrhea.  No constipation. Genitourinary: Negative for dysuria. Musculoskeletal: Negative for back pain. Skin: Negative for rash. Neurological: Negative for headaches, focal weakness or numbness.   ____________________________________________   PHYSICAL EXAM:  VITAL SIGNS: ED Triage Vitals [03/07/18 0318]  Enc Vitals Group     BP (!) 182/110     Pulse Rate (!) 103     Resp 20     Temp 99.8 F (37.7 C)     Temp Source Oral     SpO2 99 %     Weight 160 lb (72.6 kg)     Height 5\' 7"  (1.702 m)     Head Circumference      Peak Flow      Pain Score 0     Pain Loc      Pain Edu?      Excl. in GC?     Constitutional: Alert and oriented. Well appearing and in mild to moderate  acute distress. Eyes: Conjunctivae are normal. PERRL. EOMI. Head: Atraumatic. Nose: Moderate bleeding from right nares.. Mouth/Throat: Mucous membranes are moist.  Oropharynx non-erythematous. Neck: No stridor.   Cardiovascular: Normal rate, regular rhythm. Grossly normal heart sounds.  Good peripheral circulation. Respiratory: Normal respiratory effort.  No retractions. Lungs CTAB. Gastrointestinal: Soft and nontender. No distention. No abdominal bruits. No CVA tenderness. Musculoskeletal: No lower extremity tenderness nor edema.  No joint effusions. Neurologic:  Normal speech and language. No gross focal neurologic deficits are appreciated. No gait instability. Skin:  Skin is  warm, dry and intact. No rash noted. Psychiatric: Mood and affect are normal. Speech and behavior are normal.  ____________________________________________   LABS (all labs ordered are listed, but only abnormal results are displayed)  Labs Reviewed  CBC WITH DIFFERENTIAL/PLATELET - Abnormal; Notable for the following components:      Result Value   HCT 38.7 (*)    All other components within normal limits  BASIC METABOLIC PANEL - Abnormal; Notable for the following components:   Potassium 3.4 (*)    Glucose, Bld 109 (*)    All other components within normal limits  PROTIME-INR  TROPONIN I  TSH   ____________________________________________  EKG  None ____________________________________________  RADIOLOGY  ED MD interpretation: None  Official radiology report(s): No results found.  ____________________________________________   PROCEDURES  Procedure(s) performed:     .Epistaxis Management Date/Time: 03/07/2018 3:46 AM Performed by: Irean HongSung, Larron Armor J, MD Authorized by: Irean HongSung, Wilburt Messina J, MD   Consent:    Consent obtained:  Verbal   Consent given by:  Patient   Risks discussed:  Bleeding, infection, nasal injury and pain Anesthesia (see MAR for exact dosages):    Anesthesia method:  Topical application   Topical anesthetic:  Lidocaine gel Procedure details:    Treatment site:  R posterior   Treatment method:  Nasal balloon   Treatment complexity:  Extensive   Treatment episode: initial   Post-procedure details:    Assessment:  Bleeding decreased   Patient tolerance of procedure:  Tolerated well, no immediate complications    Critical Care performed: Yes, see critical care note(s)   CRITICAL CARE Performed by: Irean HongSUNG,Syvanna Ciolino J   Total critical care time: 45 minutes  Critical care time was exclusive of separately billable procedures and treating other patients.  Critical care was necessary to treat or prevent imminent or life-threatening  deterioration.  Critical care was time spent personally by me on the following activities: development of treatment plan with patient and/or surrogate as well as nursing, discussions with consultants, evaluation of patient's response to treatment, examination of patient, obtaining history from patient or surrogate, ordering and performing treatments and interventions, ordering and review of laboratory studies, ordering and review of radiographic studies, pulse oximetry and re-evaluation of patient's condition.  ____________________________________________   INITIAL IMPRESSION / ASSESSMENT AND PLAN / ED COURSE  As part of my medical decision making, I reviewed the following data within the electronic MEDICAL RECORD NUMBER Nursing notes reviewed and incorporated, Labs reviewed and Notes from prior ED visits    28 year old male on Plavix who presents with right epistaxis.  Will obtain basic lab work, insert IV.  Patient is hypertensive.  Will give antihypertensive.   Clinical Course as of Mar 07 624  Wed Mar 07, 2018  [id number] Rhino Rocket placed in right nares.  Blood clots coming from left naris.  No bleeding in posterior oropharynx.  Will reassess.   [JS]  0401 No bleeding from left nares.  No bleeding in posterior oropharynx.  Family at bedside.  IV hydralazine given.   [JS]  [id number] Patient having a panic attack because he cannot breathe out of either nostril.  Insisting on blowing his nose and a minute amount of blood coming from the right eye.  I have lessened the pressure from the [employer] Rocket.  Sats 100% but nonrebreather given to patient to place over his nose and mouth for symptomatic relief.  At one point his sats did drop to 89% after 0.5 mg IV Ativan given for calming.  Diastolic blood pressure 113; another 10 mg IV hydralazine given for blood pressure control.  Have discussed case with hospitalist Dr. Sheryle Hailiamond who will evaluate patient in the emergency department for admission.  Patient reports  his previous MI was due to "stress".  Will add troponin; patient not experiencing chest pain.   [JS]    Clinical Course User Index [JS] Irean HongSung, Galya Dunnigan J, MD     ____________________________________________   FINAL CLINICAL IMPRESSION(S) / ED DIAGNOSES  Final diagnoses:  Right-sided epistaxis  Essential hypertension  Anxiety     ED Discharge Orders    None       Note:  This document was prepared using Dragon voice recognition software and may include unintentional dictation errors.   Irean HongSung, Lennix Kneisel J, MD 03/07/18 [phone]

## 2018-03-08 LAB — CBC
HCT: 40.2 % (ref 39.0–52.0)
Hemoglobin: 13.4 g/dL (ref 13.0–17.0)
MCH: 29.4 pg (ref 26.0–34.0)
MCHC: 33.3 g/dL (ref 30.0–36.0)
MCV: 88.2 fL (ref 80.0–100.0)
Platelets: 240 10*3/uL (ref 150–400)
RBC: 4.56 MIL/uL (ref 4.22–5.81)
RDW: 12.5 % (ref 11.5–15.5)
WBC: 5.7 10*3/uL (ref 4.0–10.5)
nRBC: 0 % (ref 0.0–0.2)

## 2018-03-08 LAB — BASIC METABOLIC PANEL
ANION GAP: 8 (ref 5–15)
BUN: 9 mg/dL (ref 6–20)
CO2: 25 mmol/L (ref 22–32)
Calcium: 9.1 mg/dL (ref 8.9–10.3)
Chloride: 104 mmol/L (ref 98–111)
Creatinine, Ser: 0.99 mg/dL (ref 0.61–1.24)
GFR calc Af Amer: >60 mL/min (ref >60)
GFR calc non Af Amer: >60 mL/min (ref >60)
Glucose, Bld: 130 mg/dL — ABNORMAL HIGH (ref 70–99)
POTASSIUM: 3.4 mmol/L — AB (ref 3.5–5.1)
Sodium: 137 mmol/L (ref 135–145)

## 2018-03-08 LAB — MAGNESIUM: Magnesium: 2 mg/dL (ref 1.7–2.4)

## 2018-03-08 MED ORDER — METOPROLOL SUCCINATE ER 50 MG PO TB24: 50.0000 mg | ORAL_TABLET | Freq: Every day | ORAL | 1 refills | Status: AC

## 2018-03-08 MED ORDER — GUAIFENESIN 100 MG/5ML PO SOLN: 5.0000 mL | ORAL | 0 refills | Status: AC | PRN

## 2018-03-08 MED ORDER — POTASSIUM CHLORIDE CRYS ER 20 MEQ PO TBCR
40.0000 meq | EXTENDED_RELEASE_TABLET | Freq: Once | ORAL | Status: AC
  Administered 2018-03-08: 40 meq via ORAL
  Filled 2018-03-08: qty 2

## 2018-03-08 MED ORDER — GUAIFENESIN 100 MG/5ML PO SOLN
5.0000 mL | ORAL | Status: DC | PRN
  Filled 2018-03-08: qty 5

## 2018-03-08 MED ORDER — METOPROLOL TARTRATE 5 MG/5ML IV SOLN
10.0000 mg | Freq: Once | INTRAVENOUS | Status: AC
  Administered 2018-03-08: 10 mg via INTRAVENOUS
  Filled 2018-03-08: qty 10

## 2018-03-08 MED ORDER — OSELTAMIVIR PHOSPHATE 75 MG PO CAPS: 75.0000 mg | ORAL_CAPSULE | Freq: Two times a day (BID) | ORAL | 0 refills | Status: AC

## 2018-03-08 MED ORDER — ISOSORBIDE MONONITRATE ER 30 MG PO TB24
30.0000 mg | ORAL_TABLET | Freq: Every day | ORAL | Status: DC
  Administered 2018-03-08: 30 mg via ORAL
  Filled 2018-03-08: qty 1

## 2018-03-08 MED ORDER — METOPROLOL SUCCINATE ER 50 MG PO TB24
50.0000 mg | ORAL_TABLET | Freq: Every day | ORAL | Status: DC
  Administered 2018-03-08: 50 mg via ORAL
  Filled 2018-03-08: qty 1

## 2018-03-08 MED ORDER — METOPROLOL SUCCINATE ER 50 MG PO TB24: 50.0000 mg | ORAL_TABLET | Freq: Every day | ORAL | 1 refills | Status: DC

## 2018-03-08 MED ORDER — HYDRALAZINE HCL 25 MG PO TABS: 25.0000 mg | ORAL_TABLET | Freq: Three times a day (TID) | ORAL | 1 refills | Status: AC

## 2018-03-08 MED ORDER — AZITHROMYCIN 250 MG PO TABS: 250.0000 mg | ORAL_TABLET | Freq: Every day | ORAL | 0 refills | Status: AC

## 2018-03-08 NOTE — Discharge Summary (Signed)
[facility] [place] at [facility]   PATIENT NAME: Dennis Stewart    MR#:  [id number]  DATE OF BIRTH:  1990-04-10  DATE OF ADMISSION:  03/07/2018   ADMITTING PHYSICIAN: Arnaldo NatalMichael S Diamond, MD  DATE OF DISCHARGE: 03/08/2018 12:08 PM  PRIMARY CARE PHYSICIAN: [facility]   ADMISSION DIAGNOSIS:  Anxiety [F41.9] Right-sided epistaxis [R04.0] Essential hypertension [I10] DISCHARGE DIAGNOSIS:  Active Problems:   Hypertensive urgency  SECONDARY DIAGNOSIS:   Past Medical History:  Diagnosis Date  . Hypertension   . MI (myocardial infarction) [facility](HCC)    HOSPITAL COURSE:   28 year old male patient with history of coronary disease, hypertension, MI currently under hospitalist service nasal bleed and elevated blood pressure  -Hypertensive urgency The patient has been treated with oral metoprolol, lisinopril, Imdur and oral hydralazine.  His blood pressure is better controlled but still not well controlled.  I will increased Toprol to 50 mg.  -Epistaxis Aspirin and Plavix were held Nasal packing done which should stay for 5 days Follow-up PCP to remove nasal packing and resume aspirin and Plavix.  -Hypokalemia Replaced potassium and follow-up potassium level with PCP.  -Hyperlipidemia Continue statin therapy  -Tobacco abuse Tobacco cessation counseled to the patient for 6 minutes Nicotine patch offered  Influenza A.  The patient only complains of cough but denies any muscle aching.  He has fever last night.  And found positive influenza A.  He is started Tamiflu and Zithromax last night and continue for total 5 days.  Robitussin as needed for cough.   The patient wants to go home today.  I advised patient and his girlfriend to wear mask. DISCHARGE CONDITIONS:  Stable, discharge to home today. CONSULTS OBTAINED:   DRUG ALLERGIES:   Allergies  Allergen Reactions  . Penicillins Anaphylaxis    Did it involve swelling of the  face/tongue/throat, SOB, or low BP? Yes Did it involve sudden or severe rash/hives, skin peeling, or any reaction on the inside of your mouth or nose? Yes Did you need to seek medical attention at a hospital or doctor's office? No When did it last happen?childhood If all above answers are "NO", may proceed with cephalosporin use.    DISCHARGE MEDICATIONS:   Allergies as of 03/08/2018      Reactions   Penicillins Anaphylaxis   Did it involve swelling of the face/tongue/throat, SOB, or low BP? Yes Did it involve sudden or severe rash/hives, skin peeling, or any reaction on the inside of your mouth or nose? Yes Did you need to seek medical attention at a hospital or doctor's office? No When did it last happen?childhood If all above answers are "NO", may proceed with cephalosporin use.      Medication List    STOP taking these medications   aspirin 81 MG chewable tablet   clopidogrel 75 MG tablet Commonly known as:  PLAVIX     TAKE these medications   atorvastatin 80 MG tablet Commonly known as:  LIPITOR Take 80 mg by mouth at bedtime.   azithromycin 250 MG tablet Commonly known as:  ZITHROMAX Take 1 tablet (250 mg total) by mouth daily.   guaiFENesin 100 MG/5ML Soln Commonly known as:  ROBITUSSIN Take 5 mLs (100 mg total) by mouth every 4 (four) hours as needed for cough or to loosen phlegm.   hydrALAZINE 25 MG tablet Commonly known as:  APRESOLINE Take 1 tablet (25 mg total) by mouth every 8 (eight) hours.   isosorbide mononitrate 30  MG 24 hr tablet Commonly known as:  IMDUR Take 30 mg by mouth daily.   lisinopril 20 MG tablet Commonly known as:  PRINIVIL,ZESTRIL Take 20 mg by mouth daily.   metoprolol succinate 50 MG 24 hr tablet Commonly known as:  TOPROL-XL Take 1 tablet (50 mg total) by mouth daily. Take with or immediately following a meal. What changed:    medication strength  how much to take  additional instructions   oseltamivir 75 MG  capsule Commonly known as:  TAMIFLU Take 1 capsule (75 mg total) by mouth 2 (two) times daily.        DISCHARGE INSTRUCTIONS:  See AVS.  If you experience worsening of your admission symptoms, develop shortness of breath, life threatening emergency, suicidal or homicidal thoughts you must seek medical attention immediately by calling 911 or calling your MD immediately  if symptoms less severe.  You Must read complete instructions/literature along with all the possible adverse reactions/side effects for all the Medicines you take and that have been prescribed to you. Take any new Medicines after you have completely understood and accpet all the possible adverse reactions/side effects.   Please note  You were cared for by a hospitalist during your hospital stay. If you have any questions about your discharge medications or the care you received while you were in the hospital after you are discharged, you can call the unit and asked to speak with the hospitalist on call if the hospitalist that took care of you is not available. Once you are discharged, your primary care physician will handle any further medical issues. Please note that NO REFILLS for any discharge medications will be authorized once you are discharged, as it is imperative that you return to your primary care physician (or establish a relationship with a primary care physician if you do not have one) for your aftercare needs so that they can reassess your need for medications and monitor your lab values.    On the day of Discharge:  VITAL SIGNS:  Blood pressure (!) 163/103, pulse 97, temperature 99.8 F (37.7 C), temperature source Oral, resp. rate 18, height 5\' 7"  (1.702 m), weight 71.8 kg, SpO2 100 %. PHYSICAL EXAMINATION:  GENERAL:  28 y.o.-year-old patient lying in the bed with no acute distress.  EYES: Pupils equal, round, reactive to light and accommodation. No scleral icterus. Extraocular muscles intact.  HEENT: Head  atraumatic, normocephalic. Oropharynx and nasopharynx clear.  Nasal packing on the right side. NECK:  Supple, no jugular venous distention. No thyroid enlargement, no tenderness.  LUNGS: Normal breath sounds bilaterally, no wheezing, rales,rhonchi or crepitation. No use of accessory muscles of respiration.  CARDIOVASCULAR: S1, S2 normal. No murmurs, rubs, or gallops.  ABDOMEN: Soft, non-tender, non-distended. Bowel sounds present. No organomegaly or mass.  EXTREMITIES: No pedal edema, cyanosis, or clubbing.  NEUROLOGIC: Cranial nerves II through XII are intact. Muscle strength 5/5 in all extremities. Sensation intact. Gait not checked.  PSYCHIATRIC: The patient is alert and oriented x 3.  SKIN: No obvious rash, lesion, or ulcer.  DATA REVIEW:   CBC Recent Labs  Lab 03/08/18 0419  WBC 5.7  HGB 13.4  HCT 40.2  PLT 240    Chemistries  Recent Labs  Lab 03/08/18 0419  NA 137  K 3.4*  CL 104  CO2 25  GLUCOSE 130*  BUN 9  CREATININE 0.99  CALCIUM 9.1  MG 2.0     Microbiology Results  Results for orders placed or performed during the  hospital encounter of 03/07/18  Respiratory Panel by PCR     Status: Abnormal   Collection Time: 03/07/18  2:50 PM  Result Value Ref Range Status   Adenovirus NOT DETECTED NOT DETECTED Final   Coronavirus 229E NOT DETECTED NOT DETECTED Final    Comment: (NOTE) The Coronavirus on the Respiratory Panel, DOES NOT test for the novel  Coronavirus (2019 nCoV)    Coronavirus HKU1 NOT DETECTED NOT DETECTED Final   Coronavirus NL63 NOT DETECTED NOT DETECTED Final   Coronavirus OC43 NOT DETECTED NOT DETECTED Final   Metapneumovirus NOT DETECTED NOT DETECTED Final   Rhinovirus / Enterovirus NOT DETECTED NOT DETECTED Final   Influenza A H1 2009 DETECTED (A) NOT DETECTED Final   Influenza B NOT DETECTED NOT DETECTED Final   Parainfluenza Virus 1 NOT DETECTED NOT DETECTED Final   Parainfluenza Virus 2 NOT DETECTED NOT DETECTED Final   Parainfluenza  Virus 3 NOT DETECTED NOT DETECTED Final   Parainfluenza Virus 4 NOT DETECTED NOT DETECTED Final   Respiratory Syncytial Virus NOT DETECTED NOT DETECTED Final   Bordetella pertussis NOT DETECTED NOT DETECTED Final   Chlamydophila pneumoniae NOT DETECTED NOT DETECTED Final   Mycoplasma pneumoniae NOT DETECTED NOT DETECTED Final    Comment: Performed at [facility], [address] [place], [place] [address]    RADIOLOGY:  No results found.   Management plans discussed with the patient, family and they are in agreement.  CODE STATUS: Full Code   TOTAL TIME TAKING CARE OF THIS PATIENT: 32 minutes.    [name] M.D on 03/08/2018 at 12:28 PM  Between 7am to 6pm - Pager - [phone]  After 6pm go to [email or web address] - Scientist, research (life sciences) [facility]  Office  [phone]  CC: Primary care physician; [facility]   Note: This dictation was prepared with Nurse, children's dictation along with smaller phrase technology. Any transcriptional errors that result from this process are unintentional.

## 2018-03-08 NOTE — Discharge Instructions (Signed)
Smoking cessation. Follow up PCP within 3 days to remove nasal pack and resume ASA and plavix.

## 2018-03-09 NOTE — Progress Notes (Signed)
[facility] - [place] at [facility] Dennis Stewart was admitted to the Hospital on 03/07/2018 and Discharged  03/08/2018 and should be excused from work/school   for  7 days starting 03/07/2018 , may return to work/school without any restrictions.  [name] M.D on 03/09/2018,at 11:26 AM  [facility] - [place] at [facility]  [phone]
